# Patient Record
Sex: Male | Born: 1970 | Race: White | Hispanic: No | Marital: Single | Smoking: Former smoker
Health system: Southern US, Community
[De-identification: ages and names within clinical notes are randomized; demographics above are authoritative.]

## PROBLEM LIST (undated history)

## (undated) DIAGNOSIS — N200 Calculus of kidney: Secondary | ICD-10-CM

---

## 1998-12-14 ENCOUNTER — Encounter: Admission: RE | Admit: 1998-12-14 | Discharge: 1999-03-14 | Payer: Self-pay | Admitting: Emergency Medicine

## 2001-06-17 ENCOUNTER — Ambulatory Visit (HOSPITAL_COMMUNITY)
Admission: RE | Admit: 2001-06-17 | Discharge: 2001-06-17 | Payer: Self-pay | Admitting: Physical Medicine & Rehabilitation

## 2001-06-17 ENCOUNTER — Encounter: Payer: Self-pay | Admitting: Physical Medicine & Rehabilitation

## 2008-03-21 ENCOUNTER — Emergency Department (HOSPITAL_COMMUNITY): Admission: EM | Admit: 2008-03-21 | Discharge: 2008-03-22 | Payer: Self-pay | Admitting: Emergency Medicine

## 2010-06-27 LAB — CBC
HCT: 36 % — ABNORMAL LOW (ref 39.0–52.0)
MCV: 93.4 fL (ref 78.0–100.0)
Platelets: 237 10*3/uL (ref 150–400)
RDW: 11.7 % (ref 11.5–15.5)
WBC: 12.7 10*3/uL — ABNORMAL HIGH (ref 4.0–10.5)

## 2010-06-27 LAB — POCT I-STAT, CHEM 8
Creatinine, Ser: 0.9 mg/dL (ref 0.4–1.5)
Hemoglobin: 12.2 g/dL — ABNORMAL LOW (ref 13.0–17.0)
Potassium: 3.8 mEq/L (ref 3.5–5.1)
Sodium: 143 mEq/L (ref 135–145)
TCO2: 28 mmol/L (ref 0–100)

## 2010-06-27 LAB — URINALYSIS, ROUTINE W REFLEX MICROSCOPIC
Bilirubin Urine: NEGATIVE
Glucose, UA: NEGATIVE mg/dL
Hgb urine dipstick: NEGATIVE
Ketones, ur: NEGATIVE mg/dL
Nitrite: NEGATIVE
Protein, ur: NEGATIVE mg/dL
Specific Gravity, Urine: 1.018 (ref 1.005–1.030)
Urobilinogen, UA: 1 mg/dL (ref 0.0–1.0)
pH: 7 (ref 5.0–8.0)

## 2010-06-27 LAB — URINE MICROSCOPIC-ADD ON

## 2010-06-27 LAB — DIFFERENTIAL
Basophils Absolute: 0.1 10*3/uL (ref 0.0–0.1)
Eosinophils Absolute: 0.1 10*3/uL (ref 0.0–0.7)
Eosinophils Relative: 0 % (ref 0–5)
Lymphocytes Relative: 17 % (ref 12–46)
Lymphs Abs: 2.1 10*3/uL (ref 0.7–4.0)
Neutrophils Relative %: 76 % (ref 43–77)

## 2012-02-29 ENCOUNTER — Encounter (HOSPITAL_COMMUNITY): Payer: Self-pay | Admitting: Emergency Medicine

## 2012-02-29 ENCOUNTER — Emergency Department (HOSPITAL_COMMUNITY)
Admission: EM | Admit: 2012-02-29 | Discharge: 2012-02-29 | Disposition: A | Discharge status: Home / Self Care | Payer: Self-pay | Attending: Emergency Medicine | Admitting: Emergency Medicine

## 2012-02-29 DIAGNOSIS — R6883 Chills (without fever): Secondary | ICD-10-CM | POA: Insufficient documentation

## 2012-02-29 DIAGNOSIS — K4090 Unilateral inguinal hernia, without obstruction or gangrene, not specified as recurrent: Secondary | ICD-10-CM | POA: Insufficient documentation

## 2012-02-29 DIAGNOSIS — R112 Nausea with vomiting, unspecified: Secondary | ICD-10-CM | POA: Insufficient documentation

## 2012-02-29 DIAGNOSIS — N509 Disorder of male genital organs, unspecified: Secondary | ICD-10-CM | POA: Insufficient documentation

## 2012-02-29 DIAGNOSIS — F17200 Nicotine dependence, unspecified, uncomplicated: Secondary | ICD-10-CM | POA: Insufficient documentation

## 2012-02-29 DIAGNOSIS — R319 Hematuria, unspecified: Secondary | ICD-10-CM | POA: Insufficient documentation

## 2012-02-29 DIAGNOSIS — N200 Calculus of kidney: Secondary | ICD-10-CM | POA: Insufficient documentation

## 2012-02-29 DIAGNOSIS — Z87442 Personal history of urinary calculi: Secondary | ICD-10-CM | POA: Insufficient documentation

## 2012-02-29 HISTORY — DX: Calculus of kidney: N20.0

## 2012-02-29 LAB — URINALYSIS, ROUTINE W REFLEX MICROSCOPIC
Bilirubin Urine: NEGATIVE
Glucose, UA: NEGATIVE mg/dL
Hgb urine dipstick: NEGATIVE
Ketones, ur: 15 mg/dL — AB
pH: 8 (ref 5.0–8.0)

## 2012-02-29 MED ORDER — KETOROLAC TROMETHAMINE 30 MG/ML IJ SOLN
30.0000 mg | Freq: Once | INTRAMUSCULAR | Status: AC
  Administered 2012-02-29: 30 mg via INTRAVENOUS
  Filled 2012-02-29: qty 1

## 2012-02-29 MED ORDER — HYDROCODONE-ACETAMINOPHEN 5-325 MG PO TABS: 2.0000 | ORAL_TABLET | ORAL | Status: DC | PRN

## 2012-02-29 MED ORDER — ONDANSETRON HCL 4 MG/2ML IJ SOLN
4.0000 mg | Freq: Once | INTRAMUSCULAR | Status: AC
  Administered 2012-02-29: 4 mg via INTRAVENOUS
  Filled 2012-02-29: qty 2

## 2012-02-29 MED ORDER — HYDROMORPHONE HCL PF 1 MG/ML IJ SOLN
0.5000 mg | Freq: Once | INTRAMUSCULAR | Status: AC
  Administered 2012-02-29: 0.5 mg via INTRAVENOUS
  Filled 2012-02-29: qty 1

## 2012-02-29 NOTE — ED Notes (Signed)
Pt c/o RLQ pain that thinks could be kidney stone or hernia; pt appears uncomfortable

## 2012-02-29 NOTE — ED Notes (Signed)
PA at bedside.

## 2012-02-29 NOTE — ED Provider Notes (Signed)
History     CSN: [id number]  Arrival date & time 02/29/12  1820   First MD Initiated Contact with Patient 02/29/12 1841      Chief Complaint  Patient presents with  . Abdominal Pain    (Consider location/radiation/quality/duration/timing/severity/associated sxs/prior treatment) HPI  41 year old male with history of kidney stones presents complaining of abdominal pain. Patient acute onset of sharp stabbing pain to his R groin that radiates to his R flank and down to his R testicle a few hours ago while shopping at the mall. Pain is persistent,with associate nausea, vomiting, and chills. Pain is moderate in severity, nothing makes it better or worse. Has not tried any specific treatment yet.  Pt also has an undiagnosed R inguinal hernia that sometimes causing some swelling and dull pain.  However he said "i don't pay any attention to it".  Pt denies fever, headache, cp, sob, dysuria, hematuria.  Did not check his testicle for swelling.  No recent trauma.  Last kidney stone 4 years ago, no stenting.    Past Medical History  Diagnosis Date  . Kidney stone     History reviewed. No pertinent past surgical history.  History reviewed. No pertinent family history.  History  Substance Use Topics  . Smoking status: Current Every Day Smoker  . Smokeless tobacco: Not on file  . Alcohol Use: No      Review of Systems  Constitutional: Negative for fever.  Gastrointestinal: Positive for abdominal pain.  Genitourinary: Positive for hematuria, flank pain and testicular pain. Negative for discharge.  Musculoskeletal: Negative for back pain.  Skin: Negative for rash and wound.  Neurological: Negative for numbness.    Allergies  Review of patient's allergies indicates no known allergies.  Home Medications  No current outpatient prescriptions on file.  BP 108/79  Pulse 65  Temp 97.9 F (36.6 C) (Oral)  Resp 20  SpO2 93%  Physical Exam  Nursing note and vitals  reviewed. Constitutional: He is oriented to person, place, and time. He appears well-developed and well-nourished. He appears distressed (Appears uncomfortable, writhing in bed).  HENT:  Mouth/Throat: Oropharynx is clear and moist.  Eyes: Conjunctivae normal are normal.  Neck: Neck supple.  Cardiovascular: Normal rate and regular rhythm.   Pulmonary/Chest: He is in respiratory distress. He exhibits no tenderness.  Abdominal: Soft. There is tenderness (R CVA tenderness.  Tenderness to right inguinal region and suprapubic region without guarding or rebound tenderness. R inguinal hernia noted.). A hernia is present. Hernia confirmed positive in the right inguinal area.  Genitourinary: Penis normal.    Right testis shows tenderness. Right testis shows no mass and no swelling. Right testis is descended. Circumcised. No penile tenderness.  Musculoskeletal: He exhibits no edema.  Neurological: He is alert and oriented to person, place, and time.  Skin: Skin is warm. No rash noted.    ED Course  Procedures (including critical care time)   Labs Reviewed  URINALYSIS, ROUTINE W REFLEX MICROSCOPIC   No results found.   No diagnosis found.  Results for orders placed during the hospital encounter of 02/29/12  URINALYSIS, ROUTINE W REFLEX MICROSCOPIC      Component Value Range   Color, Urine YELLOW  YELLOW   APPearance CLEAR  CLEAR   Specific Gravity, Urine 1.011  1.005 - 1.030   pH 8.0  5.0 - 8.0   Glucose, UA NEGATIVE  NEGATIVE mg/dL   Hgb urine dipstick NEGATIVE  NEGATIVE   Bilirubin Urine NEGATIVE  NEGATIVE  Ketones, ur 15 (*) NEGATIVE mg/dL   Protein, ur NEGATIVE  NEGATIVE mg/dL   Urobilinogen, UA 0.2  0.0 - 1.0 mg/dL   Nitrite NEGATIVE  NEGATIVE   Leukocytes, UA NEGATIVE  NEGATIVE     1. R inguinal hernia  MDM  Acute onset of right flank pain in a patient with history of kidney stone.  However, on exam pt has a R inguinal hernia.  Hernia was reducible using manual  palpation.  Pt felt much better after reduction of hernia.  Pt was given toradol and dilaudid IV beforehand.  No conscious sedation.  No immediate complication.  Pt tolerates procedure well.    Will monitor for the next 2 hrs.  If pain returns and hernia haven't return, then consider CT for evaluation of kidney stone.  UA unremarkable, no hematuria.     9:08 PM Pt resting comfortably.  He has been monitored for 2 hrs since receiving pain meds.  No CVA tenderness  On reexamination.  Doubt kidney stone.  Pain most likely from hernia.  I recommend avoid lifting anything over 15 lbs.  To f/u with [facility] for further management.  Will provide a short course of pain medication as needed. Otherwise, pt stable for discharge.     9:36 PM Pt has been monitored for 3 hrs.  No recurrent pain.  Stable for discharge.    BP 108/79  Pulse 65  Temp 97.9 F (36.6 C) (Oral)  Resp 20  SpO2 93%    Fayrene Helper, PA-C 02/29/12 2136

## 2012-03-01 NOTE — ED Provider Notes (Signed)
Medical screening examination/treatment/procedure(s) were performed by non-physician practitioner and as supervising physician I was immediately available for consultation/collaboration.  Gerhard Munch, MD 03/01/12 2147

## 2014-09-21 ENCOUNTER — Ambulatory Visit (INDEPENDENT_AMBULATORY_CARE_PROVIDER_SITE_OTHER): Payer: 59

## 2014-09-21 ENCOUNTER — Ambulatory Visit (INDEPENDENT_AMBULATORY_CARE_PROVIDER_SITE_OTHER): Payer: 59 | Admitting: Family Medicine

## 2014-09-21 VITALS — BP 144/81 | HR 65 | Temp 98.7°F | Resp 18 | Ht 71.0 in | Wt 179.8 lb

## 2014-09-21 DIAGNOSIS — S92912A Unspecified fracture of left toe(s), initial encounter for closed fracture: Secondary | ICD-10-CM

## 2014-09-21 DIAGNOSIS — M79672 Pain in left foot: Secondary | ICD-10-CM

## 2014-09-21 NOTE — Progress Notes (Signed)
[facility] [address], [place] [id number] [phone]  Date:  09/21/2014   Name:  Edward Short   DOB:  05/03/1970   MRN:  [id number]  PCP:  Default, Provider, MD    Chief Complaint: Foot Injury   History of Present Illness:  This is a [age] y.o. male who is presenting with a left foot injury from a fall that occurred last night. He was running in the yard with his child when he ran into a potted plant with his left foot. His 4th and 5th toes made contact and hyperflexed. He works in [employer] and is on his feet all day. Pain was much worse after work this evening. Pain is described as sharp. The only thing he has tried is ice which helps some. He denies numbness or weakness. He is not having any ankle pain. States he broke a bone in his lateral left foot several years ago - healed on own, did not need surgery.  Review of Systems:  Review of Systems See HPI  Patient Active Problem List   Diagnosis Date Noted  . Kidney stone     Prior to Admission medications   Not on File    No Known Allergies  History reviewed. No pertinent past surgical history.  History  Substance Use Topics  . Smoking status: Former Games developermoker  . Smokeless tobacco: Not on file  . Alcohol Use: No    History reviewed. No pertinent family history.  Medication list has been reviewed and updated.  Physical Examination:  Physical Exam  Constitutional: He is oriented to person, place, and time. He appears well-developed and well-nourished. No distress.  HENT:  Head: Normocephalic and atraumatic.  Right Ear: Hearing normal.  Left Ear: Hearing normal.  Nose: Nose normal.  Eyes: Conjunctivae and lids are normal. Right eye exhibits no discharge. Left eye exhibits no discharge. No scleral icterus.  Cardiovascular: Normal rate, regular rhythm, intact distal pulses and normal pulses.   Pulmonary/Chest: Effort normal. No respiratory distress.  Musculoskeletal: Normal range of motion.      Right ankle: Normal.       Left ankle: Normal.       Right foot: Normal.       Left foot: There is tenderness, bony tenderness (diffusely over 5th digit, MTP of 4th digit) and swelling (5th digit). There is normal range of motion, normal capillary refill, no deformity and no laceration.  Ecchymosis over left 5th digit of foot  Neurological: He is alert and oriented to person, place, and time. No sensory deficit. Gait (antalgic) abnormal.  Skin: Skin is warm, dry and intact. Ecchymosis noted.  Psychiatric: He has a normal mood and affect. His speech is normal and behavior is normal. Thought content normal.   BP 144/81 mmHg  Pulse 65  Temp(Src) 98.7 F (37.1 C) (Oral)  Resp 18  Ht 5\' 11"  (1.803 m)  Wt 179 lb 12.8 oz (81.557 kg)  BMI 25.09 kg/m2  SpO2 98%  [facility] reading (PRIMARY) by  Dr. Alwyn RenHopper: Fracture of left 5th proximal phalange.  Assessment and Plan:  1. Toe fracture, left, closed, initial encounter 2. Left foot pain Radiograph shows nondisplaced fracture of left foot 5th digit. Pt placed in post-op boot which he will wear for at least next 2 weeks until follow up. Buddy taped placed on 4th and 5th digits. At follow up will do repeat xray. If clinically improved, may graduate from post-op shoe to supportive shoe at home  with toes buddy taped at all times for the next 2 weeks. If asymptomatic at 4 weeks, may stop buddy taping. If still symptomatic, continue buddy taping for 2 more weeks. Counseled on tylenol/ibuprofen and RICE protocol. Follow up with Deboraha Sprang in 2 weeks. - DG Foot Complete Left; Future   [place] [employer]. Dyke Brackett, MHS [facility]  09/21/2014

## 2014-09-21 NOTE — Progress Notes (Signed)
Discussed with Lanier ClamNicole Bush, PA. X-rays were examined. There is a fracture noted of the proximal phalanx of the fifth toe. It is nondisplaced. Discussed treatment and agree with Buddy tape approach. Should wear supportive shoes. Return in a couple of weeks for recheck.

## 2014-09-21 NOTE — Patient Instructions (Signed)
Take ibuprofen 800 mg three times a day alternating with 1000 mg tylenol three times a day. Wear boot for 3 weeks. Return for follow up and repeat xray in 2 weeks. After 3 weeks in the boot, you may start wearing supportive shoe at home but keep taped to your 4th toe for next 1-3 weeks, depending on pain. You may stop if no more pain, otherwise keep taped until 6 weeks from injury. Ice and elevation can help.  RICE: Routine Care for Injuries The routine care of many injuries includes Rest, Ice, Compression, and Elevation (RICE). HOME CARE INSTRUCTIONS  Rest is needed to allow your body to heal. Routine activities can usually be resumed when comfortable. Injured tendons and bones can take up to 6 weeks to heal. Tendons are the cord-like structures that attach muscle to bone.  Ice following an injury helps keep the swelling down and reduces pain.  Put ice in a plastic bag.  Place a towel between your skin and the bag.  Leave the ice on for 15-20 minutes, 3-4 times a day, or as directed by your health care provider. Do this while awake, for the first 24 to 48 hours. After that, continue as directed by your caregiver.  Compression helps keep swelling down. It also gives support and helps with discomfort. If an elastic bandage has been applied, it should be removed and reapplied every 3 to 4 hours. It should not be applied tightly, but firmly enough to keep swelling down. Watch fingers or toes for swelling, bluish discoloration, coldness, numbness, or excessive pain. If any of these problems occur, remove the bandage and reapply loosely. Contact your caregiver if these problems continue.  Elevation helps reduce swelling and decreases pain. With extremities, such as the arms, hands, legs, and feet, the injured area should be placed near or above the level of the heart, if possible. SEEK IMMEDIATE MEDICAL CARE IF:  You have persistent pain and swelling.  You develop redness, numbness, or  unexpected weakness.  Your symptoms are getting worse rather than improving after several days. These symptoms may indicate that further evaluation or further X-rays are needed. Sometimes, X-rays may not show a small broken bone (fracture) until 1 week or 10 days later. Make a follow-up appointment with your caregiver. Ask when your X-ray results will be ready. Make sure you get your X-ray results. Document Released: 06/11/2000 Document Revised: 03/04/2013 Document Reviewed: 07/29/2010 [facility] Patient Information 2015 [employer] [place]. This information is not intended to replace advice given to you by your health care provider. Make sure you discuss any questions you have with your health care provider.

## 2014-10-06 ENCOUNTER — Ambulatory Visit (INDEPENDENT_AMBULATORY_CARE_PROVIDER_SITE_OTHER): Payer: 59

## 2014-10-06 ENCOUNTER — Encounter: Payer: Self-pay | Admitting: Physician Assistant

## 2014-10-06 ENCOUNTER — Ambulatory Visit (INDEPENDENT_AMBULATORY_CARE_PROVIDER_SITE_OTHER): Payer: 59 | Admitting: Emergency Medicine

## 2014-10-06 VITALS — BP 146/86 | HR 86 | Temp 99.3°F | Resp 16 | Ht 71.0 in | Wt 177.2 lb

## 2014-10-06 DIAGNOSIS — S92912D Unspecified fracture of left toe(s), subsequent encounter for fracture with routine healing: Secondary | ICD-10-CM | POA: Diagnosis not present

## 2014-10-06 DIAGNOSIS — R030 Elevated blood-pressure reading, without diagnosis of hypertension: Secondary | ICD-10-CM

## 2014-10-06 DIAGNOSIS — IMO0001 Reserved for inherently not codable concepts without codable children: Secondary | ICD-10-CM

## 2014-10-06 NOTE — Patient Instructions (Signed)
Buddy tape for next 2 weeks. If asymptomatic in 2 weeks may stop. If still having symptoms, continue buddy taping for following 2 weeks. Always wear a supportive shoes, never be bare footed or in flip flops. Return with problems. Keep a BP record - take 3-4 days a week. Watch the salt in your diet. Try to exercise 3-4 times a week. Return for a complete physical in 2 months - IT'S TIME!!!!!!!!!!!!!!!!!!!!

## 2014-10-06 NOTE — Progress Notes (Signed)
[facility] [address] [place] [address] [phone]  Date:  10/06/2014   Name:  Edward Short   DOB:  [birth year]   MRN:  [id number]  PCP:  Default, Provider, MD    Chief Complaint: Follow-up   History of Present Illness:  This is a 44 y.o. male who is presenting to follow up left 5th proximal toe fracture. Dx'd with fracture 2 weeks ago. He was to buddy tape 4th and 5th toes and wear post-op shoe until f/u. He states today that symptoms are improved. He works in [employer] and is on his feet all day. States at the end of the day he esp notices pain. Swelling has gone down. He is icing the toe at night and using ibuprofen and tylenol as needed. He denies paresthesias.  Pt with elevated BP to 140s/90s today. When he was seen 2 weeks ago BP reading of 144/81. He has not seen a PCP or had a physical in >10 years. He is wary of starting medication today. His girlfriend is a CMA and has a BP cuff and stethoscope at home. He denies headache, dizziness, blurred vision, CP, SOB. He does not smoke. He exercises by walking. States softball is starting back up in 1 month. Does not eat out frequently - cooks at home more.  Review of Systems:  Review of Systems See HPI.  Patient Active Problem List   Diagnosis Date Noted  . Kidney stone     Prior to Admission medications   Not on File    No Known Allergies  No past surgical history on file.  History  Substance Use Topics  . Smoking status: Former Games developer  . Smokeless tobacco: Not on file  . Alcohol Use: No    No family history on file.  Medication list has been reviewed and updated.  Physical Examination:  Physical Exam  Constitutional: He is oriented to person, place, and time. He appears well-developed and well-nourished. No distress.  HENT:  Head: Normocephalic and atraumatic.  Right Ear: Hearing normal.  Left Ear: Hearing normal.  Nose: Nose normal.  Eyes: Conjunctivae and lids are normal. Right  eye exhibits no discharge. Left eye exhibits no discharge. No scleral icterus.  Cardiovascular: Normal rate, regular rhythm, normal heart sounds and normal pulses.   No murmur heard. Pulmonary/Chest: Effort normal and breath sounds normal. No respiratory distress.  Musculoskeletal: Normal range of motion.       Right foot: Normal.  Mild swelling of left 5th toe. No erythema or ecchymosis. Mild tenderness with palpation over 5th proximal phalanx. Buddy tape placed over 4th and 5th digits.  Neurological: He is alert and oriented to person, place, and time.  Skin: Skin is warm, dry and intact. No lesion and no rash noted.  Psychiatric: He has a normal mood and affect. His speech is normal and behavior is normal. Thought content normal.   BP 140/98 mmHg  Pulse 86  Temp(Src) 99.3 F (37.4 C) (Oral)  Resp 16  Ht 5\' 11"  (1.803 m)  Wt 177 lb 3.2 oz (80.377 kg)  BMI 24.73 kg/m2  SpO2 97%   BP recheck 146/86  [facility] reading (PRIMARY) by  Dr. Cleta Alberts: normal alignment, nondisplaced.  Assessment and Plan:  1. Toe fracture, left, with routine healing, subsequent encounter Normal alignment on radiograph. He will graduate from post-op shoe to supportive shoe at home. Continue buddy taping for at least next 2 weeks. If asymptomatic at 2 weeks, may stop  buddy taping. If still symptomatic, continue buddy taping for following 2 weeks. Return with problems/concerns. - DG Toe 5th Left; Future  2. Elevated BP BP elevated to 146/86. He is wary about starting meds at this time. He has a manual cuff at home. His girlfriend is a CMA. He will start taking BP 3-4 times a week and keep a diary. Watch salt intake. Exercise 3-4 times a week. BMI normal. Return for CPE in 2 months.   [place] [employer] Dyke Brackett, MHS [facility]  10/06/2014

## 2014-12-15 ENCOUNTER — Encounter: Payer: Self-pay | Admitting: Physician Assistant

## 2015-01-11 ENCOUNTER — Other Ambulatory Visit: Payer: Self-pay | Admitting: Family Medicine

## 2015-01-11 DIAGNOSIS — R2231 Localized swelling, mass and lump, right upper limb: Secondary | ICD-10-CM

## 2015-01-14 ENCOUNTER — Ambulatory Visit
Admission: RE | Admit: 2015-01-14 | Discharge: 2015-01-14 | Disposition: A | Discharge status: Home / Self Care | Payer: 59 | Source: Ambulatory Visit | Attending: Family Medicine | Admitting: Family Medicine

## 2015-01-14 ENCOUNTER — Other Ambulatory Visit: Payer: Self-pay | Admitting: Family Medicine

## 2015-01-14 DIAGNOSIS — R2231 Localized swelling, mass and lump, right upper limb: Secondary | ICD-10-CM

## 2017-07-22 IMAGING — CR DG TOE 5TH 2+V*L*
2 series · 2 of 2 positions shown · non-contrast
Comparison: 09/21/2014

CLINICAL DATA: Followup toe fracture left fifth toe

EXAM:
DG TOE 5TH LEFT

[lateral]
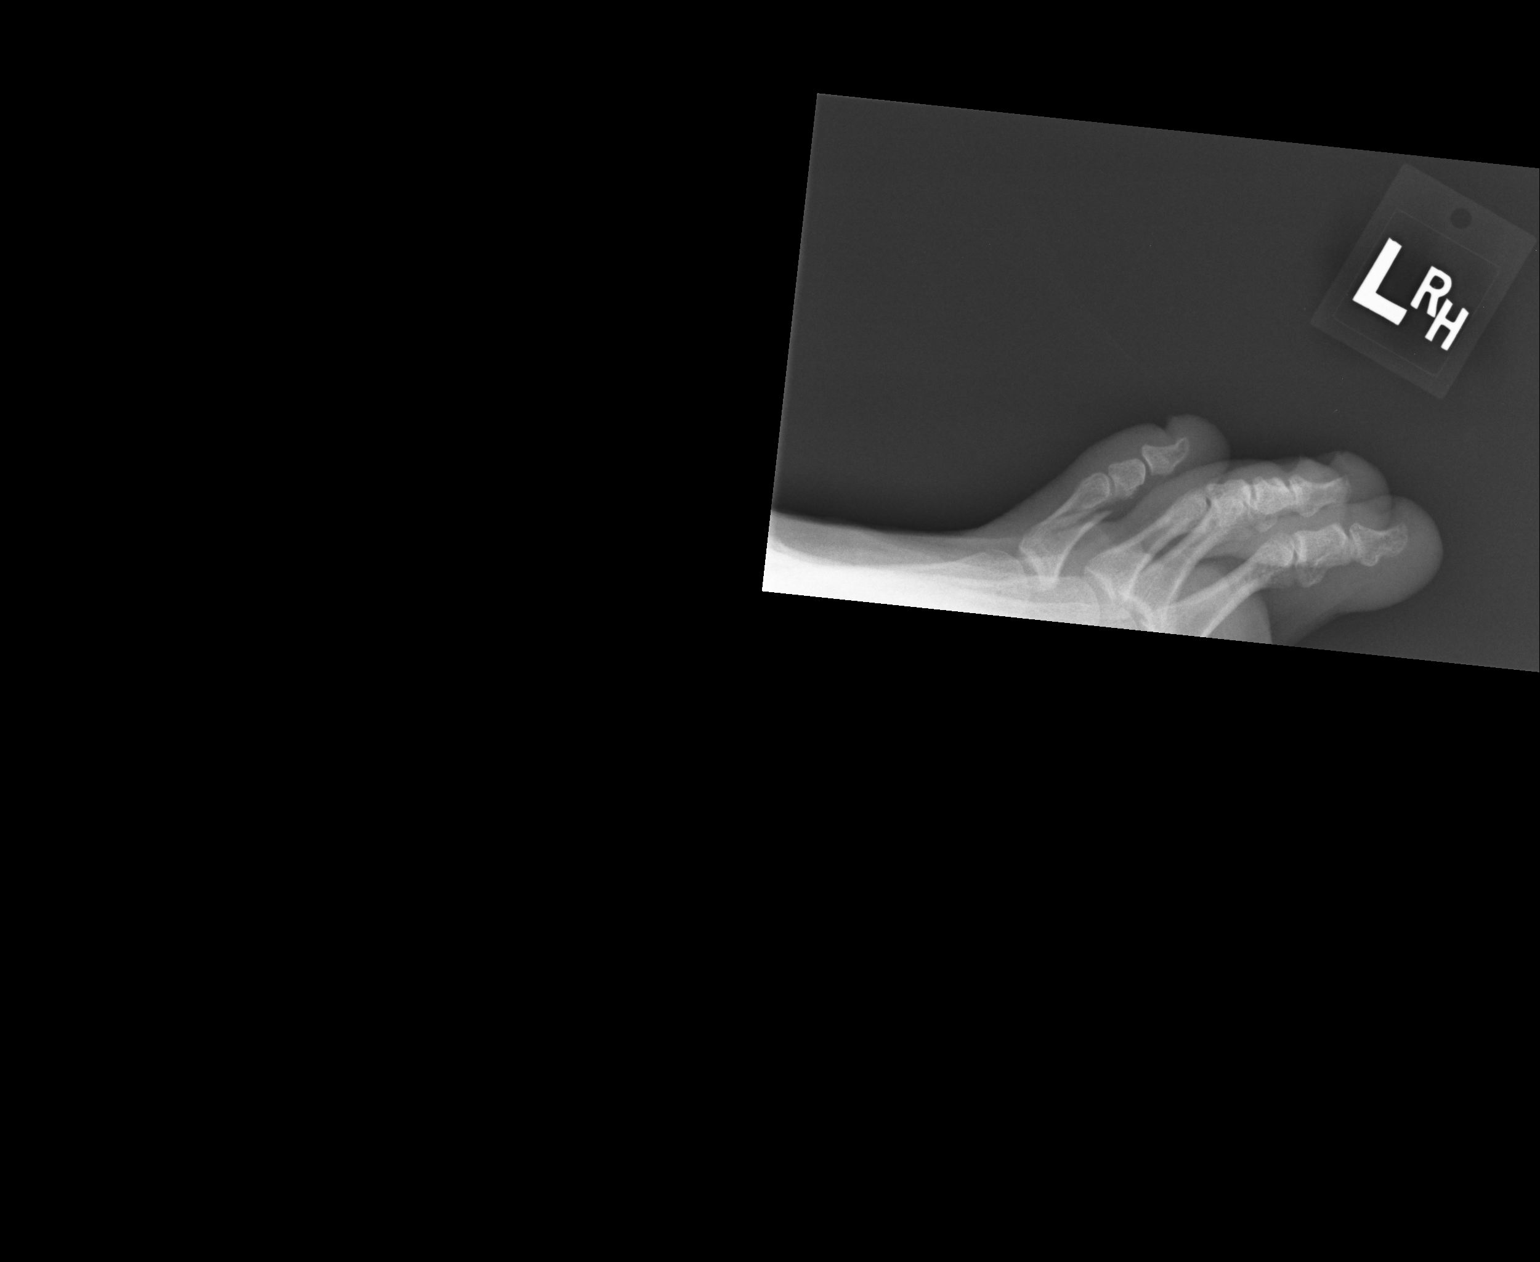

[AP]
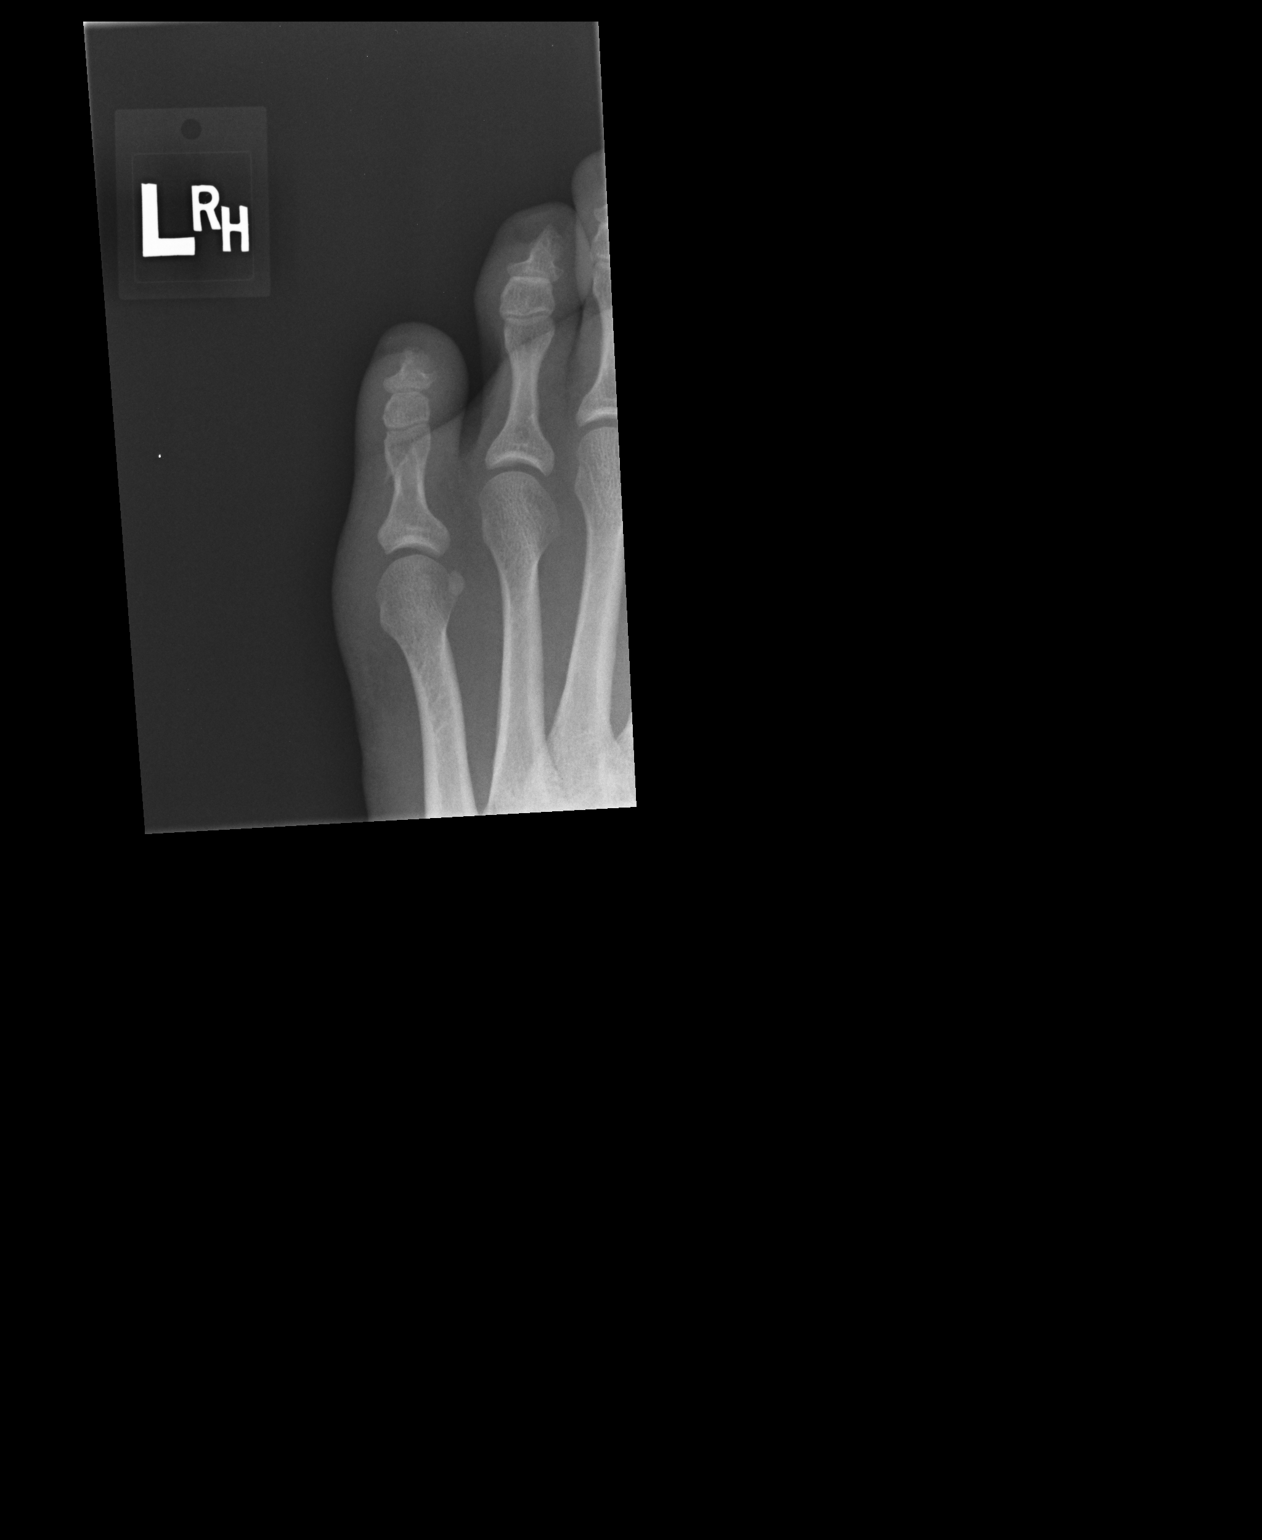

[2 of 2 positions shown; findings below may reference images not displayed]

FINDINGS: Oblique fracture fifth proximal phalanx mildly displaced with mild
interval healing. Lateral view suggests minimal increase in
displacement.
IMPRESSION: Mildly displaced fracture fifth proximal phalanx similar to prior
study with mild but incomplete interval healing. Minimal increase in
the degree of displacement suggested on the lateral view may be
projectional. Radiographic follow-up recommended.
